# Patient Record
Sex: Female | Born: 1995 | Race: Black or African American | Hispanic: No | Marital: Single | Smoking: Current some day smoker
Health system: Southern US, Community
[De-identification: ages and names within clinical notes are randomized; demographics above are authoritative.]

## PROBLEM LIST (undated history)

## (undated) HISTORY — PX: TONSILLECTOMY: SUR1361

---

## 2014-11-17 ENCOUNTER — Emergency Department (HOSPITAL_COMMUNITY)
Admission: EM | Admit: 2014-11-17 | Discharge: 2014-11-17 | Disposition: A | Discharge status: Home / Self Care | Payer: Self-pay | Attending: Emergency Medicine | Admitting: Emergency Medicine

## 2014-11-17 ENCOUNTER — Encounter (HOSPITAL_COMMUNITY): Payer: Self-pay | Admitting: *Deleted

## 2014-11-17 DIAGNOSIS — M546 Pain in thoracic spine: Secondary | ICD-10-CM

## 2014-11-17 DIAGNOSIS — Y92410 Unspecified street and highway as the place of occurrence of the external cause: Secondary | ICD-10-CM | POA: Insufficient documentation

## 2014-11-17 DIAGNOSIS — Y998 Other external cause status: Secondary | ICD-10-CM | POA: Insufficient documentation

## 2014-11-17 DIAGNOSIS — M542 Cervicalgia: Secondary | ICD-10-CM

## 2014-11-17 DIAGNOSIS — S299XXA Unspecified injury of thorax, initial encounter: Secondary | ICD-10-CM | POA: Insufficient documentation

## 2014-11-17 DIAGNOSIS — S199XXA Unspecified injury of neck, initial encounter: Secondary | ICD-10-CM | POA: Insufficient documentation

## 2014-11-17 DIAGNOSIS — Y9389 Activity, other specified: Secondary | ICD-10-CM | POA: Insufficient documentation

## 2014-11-17 MED ORDER — IBUPROFEN 200 MG PO TABS
600.0000 mg | ORAL_TABLET | Freq: Once | ORAL | Status: AC
  Administered 2014-11-17: 600 mg via ORAL
  Filled 2014-11-17: qty 3
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
## 2014-11-17 NOTE — Discharge Instructions (Signed)
Return to the emergency room with worsening of symptoms, new symptoms or with symptoms that are concerning, especially fevers, loss of control of bladder or bowels, numbness or tingling around genital region or anus, weakness. °RICE: Rest, Ice (three cycles of 20 mins on, 20mins off at least twice a day), compression/brace, elevation. Heating pad works well for back pain. °Ibuprofen 400mg (2 tablets 200mg) every 5-6 hours for 3-5 days and then as needed for pain. °Follow up with PCP/urgent care if symptoms worsen or are persistent. ° ° °Back Pain, Adult °Low back pain is very common. About 1 in 5 people have back pain. The cause of low back pain is rarely dangerous. The pain often gets better over time. About half of people with a sudden onset of back pain feel better in just 2 weeks. About 8 in 10 people feel better by 6 weeks.  °CAUSES °Some common causes of back pain include: °· Strain of the muscles or ligaments supporting the spine. °· Wear and tear (degeneration) of the spinal discs. °· Arthritis. °· Direct injury to the back. °DIAGNOSIS °Most of the time, the direct cause of low back pain is not known. However, back pain can be treated effectively even when the exact cause of the pain is unknown. Answering your caregiver's questions about your overall health and symptoms is one of the most accurate ways to make sure the cause of your pain is not dangerous. If your caregiver needs more information, he or she may order lab work or imaging tests (X-rays or MRIs). However, even if imaging tests show changes in your back, this usually does not require surgery. °HOME CARE INSTRUCTIONS °For many people, back pain returns. Since low back pain is rarely dangerous, it is often a condition that people can learn to manage on their own.  °· Remain active. It is stressful on the back to sit or stand in one place. Do not sit, drive, or stand in one place for more than 30 minutes at a time. Take short walks on level surfaces  as soon as pain allows. Try to increase the length of time you walk each day. °· Do not stay in bed. Resting more than 1 or 2 days can delay your recovery. °· Do not avoid exercise or work. Your body is made to move. It is not dangerous to be active, even though your back may hurt. Your back will likely heal faster if you return to being active before your pain is gone. °· Pay attention to your body when you  bend and lift. Many people have less discomfort when lifting if they bend their knees, keep the load close to their bodies, and avoid twisting. Often, the most comfortable positions are those that put less stress on your recovering back. °· Find a comfortable position to sleep. Use a firm mattress and lie on your side with your knees slightly bent. If you lie on your back, put a pillow under your knees. °· Only take over-the-counter or prescription medicines as directed by your caregiver. Over-the-counter medicines to reduce pain and inflammation are often the most helpful. Your caregiver may prescribe muscle relaxant drugs. These medicines help dull your pain so you can more quickly return to your normal activities and healthy exercise. °· Put ice on the injured area. °· Put ice in a plastic bag. °· Place a towel between your skin and the bag. °· Leave the ice on for 15-20 minutes, 03-04 times a day for the first 2 to 3 days. After that, ice and heat may be alternated to reduce pain and spasms. °·   Ask your caregiver about trying back exercises and gentle massage. This may be of some benefit. °· Avoid feeling anxious or stressed. Stress increases muscle tension and can worsen back pain. It is important to recognize when you are anxious or stressed and learn ways to manage it. Exercise is a great option. °SEEK MEDICAL CARE IF: °· You have pain that is not relieved with rest or medicine. °· You have pain that does not improve in 1 week. °· You have new symptoms. °· You are generally not feeling well. °SEEK  IMMEDIATE MEDICAL CARE IF:  °· You have pain that radiates from your back into your legs. °· You develop new bowel or bladder control problems. °· You have unusual weakness or numbness in your arms or legs. °· You develop nausea or vomiting. °· You develop abdominal pain. °· You feel faint. °Document Released: 10/27/2005 Document Revised: 04/27/2012 Document Reviewed: 02/28/2014 °[employer]® Patient Information ©2015 [employer]. This information is not intended to replace advice given to you by your health care provider. Make sure you discuss any questions you have with your health care provider. ° °Cervical Sprain °A cervical sprain is an injury in the neck in which the strong, fibrous tissues (ligaments) that connect your neck bones stretch or tear. Cervical sprains can range from mild to severe. Severe cervical sprains can cause the neck vertebrae to be unstable. This can lead to damage of the spinal cord and can result in serious nervous system problems. The amount of time it takes for a cervical sprain to get better depends on the cause and extent of the injury. Most cervical sprains heal in 1 to 3 weeks. °CAUSES  °Severe cervical sprains may be caused by:  °· Contact sport injuries (such as from football, rugby, wrestling, hockey, auto racing, gymnastics, diving, martial arts, or boxing).   °· Motor vehicle collisions.   °· Whiplash injuries. This is an injury from a sudden forward and backward whipping movement of the head and neck.  °· Falls.   °Mild cervical sprains may be caused by:  °· Being in an awkward position, such as while cradling a telephone between your ear and shoulder.   °· Sitting in a chair that does not offer proper support.   °· Working at a poorly designed computer station.   °· Looking up or down for long periods of time.   °SYMPTOMS  °· Pain, soreness, stiffness, or a burning sensation in the front, back, or sides of the neck. This discomfort may develop immediately after the injury or  slowly, 24 hours or more after the injury.   °· Pain or tenderness directly in the middle of the back of the neck.   °· Shoulder or upper back pain.   °· Limited ability to move the neck.   °· Headache.   °· Dizziness.   °· Weakness, numbness, or tingling in the hands or arms.   °· Muscle spasms.   °· Difficulty swallowing or chewing.   °· Tenderness and swelling of the neck.   °DIAGNOSIS  °Most of the time your health care provider can diagnose a cervical sprain by taking your history and doing a physical exam. Your health care provider will ask about previous neck injuries and any known neck problems, such as arthritis in the neck. X-rays may be taken to find out if there are any other problems, such as with the bones of the neck. Other tests, such as a CT scan or MRI, may also be needed.  °TREATMENT  °Treatment depends on the severity of the cervical sprain. Mild sprains can be treated with rest, keeping   the neck in place (immobilization), and pain medicines. Severe cervical sprains are immediately immobilized. Further treatment is done to help with pain, muscle spasms, and other symptoms and may include: °· Medicines, such as pain relievers, numbing medicines, or muscle relaxants.   °· Physical therapy. This may involve stretching exercises, strengthening exercises, and posture training. Exercises and improved posture can help stabilize the neck, strengthen muscles, and help stop symptoms from returning.   °HOME CARE INSTRUCTIONS  °· Put ice on the injured area.   °¨ Put ice in a plastic bag.   °¨ Place a towel between your skin and the bag.   °¨ Leave the ice on for 15-20 minutes, 3-4 times a day.   °· If your injury was severe, you may have been given a cervical collar to wear. A cervical collar is a two-piece collar designed to keep your neck from moving while it heals. °¨ Do not remove the collar unless instructed by your health care provider. °¨ If you have long hair, keep it outside of the collar. °¨ Ask  your health care provider before making any adjustments to your collar. Minor adjustments may be required over time to improve comfort and reduce pressure on your chin or on the back of your head. °¨ If you are allowed to remove the collar for cleaning or bathing, follow your health care provider's instructions on how to do so safely. °¨ Keep your collar clean by wiping it with mild soap and water and drying it completely. If the collar you have been given includes removable pads, remove them every 1-2 days and hand wash them with soap and water. Allow them to air dry. They should be completely dry before you wear them in the collar. °¨ If you are allowed to remove the collar for cleaning and bathing, wash and dry the skin of your neck. Check your skin for irritation or sores. If you see any, tell your health care provider. °¨ Do not drive while wearing the collar.   °· Only take over-the-counter or prescription medicines for pain, discomfort, or fever as directed by your health care provider.   °· Keep all follow-up appointments as directed by your health care provider.   °· Keep all physical therapy appointments as directed by your health care provider.   °· Make any needed adjustments to your workstation to promote good posture.   °· Avoid positions and activities that make your symptoms worse.   °· Warm up and stretch before being active to help prevent problems.   °SEEK MEDICAL CARE IF:  °· Your pain is not controlled with medicine.   °· You are unable to decrease your pain medicine over time as planned.   °· Your activity level is not improving as expected.   °SEEK IMMEDIATE MEDICAL CARE IF:  °· You develop any bleeding. °· You develop stomach upset. °· You have signs of an allergic reaction to your medicine.   °· Your symptoms get worse.   °· You develop new, unexplained symptoms.   °· You have numbness, tingling, weakness, or paralysis in any part of your body.   °MAKE SURE YOU:  °· Understand these  instructions. °· Will watch your condition. °· Will get help right away if you are not doing well or get worse. °Document Released: 08/24/2007 Document Revised: 11/01/2013 Document Reviewed: 05/04/2013 °[employer]® Patient Information ©2015 [employer]. This information is not intended to replace advice given to you by your health care provider. Make sure you discuss any questions you have with your health care provider. ° ° °

## 2014-11-25 ENCOUNTER — Emergency Department (HOSPITAL_COMMUNITY): Payer: No Typology Code available for payment source

## 2014-11-25 ENCOUNTER — Encounter (HOSPITAL_COMMUNITY): Payer: Self-pay | Admitting: Emergency Medicine

## 2014-11-25 ENCOUNTER — Emergency Department (HOSPITAL_COMMUNITY)
Admission: EM | Admit: 2014-11-25 | Discharge: 2014-11-25 | Disposition: A | Discharge status: Home / Self Care | Payer: No Typology Code available for payment source | Attending: Emergency Medicine | Admitting: Emergency Medicine

## 2014-11-25 DIAGNOSIS — Y998 Other external cause status: Secondary | ICD-10-CM | POA: Insufficient documentation

## 2014-11-25 DIAGNOSIS — Y9389 Activity, other specified: Secondary | ICD-10-CM | POA: Insufficient documentation

## 2014-11-25 DIAGNOSIS — Z720 Tobacco use: Secondary | ICD-10-CM | POA: Insufficient documentation

## 2014-11-25 DIAGNOSIS — Z3202 Encounter for pregnancy test, result negative: Secondary | ICD-10-CM | POA: Insufficient documentation

## 2014-11-25 DIAGNOSIS — S161XXA Strain of muscle, fascia and tendon at neck level, initial encounter: Secondary | ICD-10-CM | POA: Insufficient documentation

## 2014-11-25 DIAGNOSIS — S39012A Strain of muscle, fascia and tendon of lower back, initial encounter: Secondary | ICD-10-CM | POA: Insufficient documentation

## 2014-11-25 DIAGNOSIS — Y92410 Unspecified street and highway as the place of occurrence of the external cause: Secondary | ICD-10-CM | POA: Insufficient documentation

## 2014-11-25 LAB — POC URINE PREG, ED: Preg Test, Ur: NEGATIVE

## 2014-11-25 MED ORDER — METHOCARBAMOL 500 MG PO TABS: 250.0000 mg | ORAL_TABLET | Freq: Two times a day (BID) | ORAL | Status: DC

## 2014-11-25 MED ORDER — NAPROXEN 375 MG PO TABS: 375.0000 mg | ORAL_TABLET | Freq: Two times a day (BID) | ORAL | Status: DC

## 2014-11-25 NOTE — Discharge Instructions (Signed)
Cervical Sprain °A cervical sprain is an injury in the neck in which the strong, fibrous tissues (ligaments) that connect your neck bones stretch or tear. Cervical sprains can range from mild to severe. Severe cervical sprains can cause the neck vertebrae to be unstable. This can lead to damage of the spinal cord and can result in serious nervous system problems. The amount of time it takes for a cervical sprain to get better depends on the cause and extent of the injury. Most cervical sprains heal in 1 to 3 weeks. °CAUSES  °Severe cervical sprains may be caused by:  °· Contact sport injuries (such as from football, rugby, wrestling, hockey, auto racing, gymnastics, diving, martial arts, or boxing).   °· Motor vehicle collisions.   °· Whiplash injuries. This is an injury from a sudden forward and backward whipping movement of the head and neck.  °· Falls.   °Mild cervical sprains may be caused by:  °· Being in an awkward position, such as while cradling a telephone between your ear and shoulder.   °· Sitting in a chair that does not offer proper support.   °· Working at a poorly designed computer station.   °· Looking up or down for long periods of time.   °SYMPTOMS  °· Pain, soreness, stiffness, or a burning sensation in the front, back, or sides of the neck. This discomfort may develop immediately after the injury or slowly, 24 hours or more after the injury.   °· Pain or tenderness directly in the middle of the back of the neck.   °· Shoulder or upper back pain.   °· Limited ability to move the neck.   °· Headache.   °· Dizziness.   °· Weakness, numbness, or tingling in the hands or arms.   °· Muscle spasms.   °· Difficulty swallowing or chewing.   °· Tenderness and swelling of the neck.   °DIAGNOSIS  °Most of the time your health care provider can diagnose a cervical sprain by taking your history and doing a physical exam. Your health care provider will ask about previous neck injuries and any known neck  problems, such as arthritis in the neck. X-rays may be taken to find out if there are any other problems, such as with the bones of the neck. Other tests, such as a CT scan or MRI, may also be needed.  °TREATMENT  °Treatment depends on the severity of the cervical sprain. Mild sprains can be treated with rest, keeping the neck in place (immobilization), and pain medicines. Severe cervical sprains are immediately immobilized. Further treatment is done to help with pain, muscle spasms, and other symptoms and may include: °· Medicines, such as pain relievers, numbing medicines, or muscle relaxants.   °· Physical therapy. This may involve stretching exercises, strengthening exercises, and posture training. Exercises and improved posture can help stabilize the neck, strengthen muscles, and help stop symptoms from returning.   °HOME CARE INSTRUCTIONS  °· Put ice on the injured area.   °· Put ice in a plastic bag.   °· Place a towel between your skin and the bag.   °· Leave the ice on for 15-20 minutes, 3-4 times a day.   °· If your injury was severe, you may have been given a cervical collar to wear. A cervical collar is a two-piece collar designed to keep your neck from moving while it heals. °· Do not remove the collar unless instructed by your health care provider. °· If you have long hair, keep it outside of the collar. °· Ask your health care provider before making any adjustments to your collar. Minor   adjustments may be required over time to improve comfort and reduce pressure on your chin or on the back of your head. °· If you are allowed to remove the collar for cleaning or bathing, follow your health care provider's instructions on how to do so safely. °· Keep your collar clean by wiping it with mild soap and water and drying it completely. If the collar you have been given includes removable pads, remove them every 1-2 days and hand wash them with soap and water. Allow them to air dry. They should be completely  dry before you wear them in the collar. °· If you are allowed to remove the collar for cleaning and bathing, wash and dry the skin of your neck. Check your skin for irritation or sores. If you see any, tell your health care provider. °· Do not drive while wearing the collar.   °· Only take over-the-counter or prescription medicines for pain, discomfort, or fever as directed by your health care provider.   °· Keep all follow-up appointments as directed by your health care provider.   °· Keep all physical therapy appointments as directed by your health care provider.   °· Make any needed adjustments to your workstation to promote good posture.   °· Avoid positions and activities that make your symptoms worse.   °· Warm up and stretch before being active to help prevent problems.   °SEEK MEDICAL CARE IF:  °· Your pain is not controlled with medicine.   °· You are unable to decrease your pain medicine over time as planned.   °· Your activity level is not improving as expected.   °SEEK IMMEDIATE MEDICAL CARE IF:  °· You develop any bleeding. °· You develop stomach upset. °· You have signs of an allergic reaction to your medicine.   °· Your symptoms get worse.   °· You develop new, unexplained symptoms.   °· You have numbness, tingling, weakness, or paralysis in any part of your body.   °MAKE SURE YOU:  °· Understand these instructions. °· Will watch your condition. °· Will get help right away if you are not doing well or get worse. °Document Released: 08/24/2007 Document Revised: 11/01/2013 Document Reviewed: 05/04/2013 °[employer]® Patient Information ©2015 [employer]. This information is not intended to replace advice given to you by your health care provider. Make sure you discuss any questions you have with your health care provider. ° °Lumbosacral Strain °Lumbosacral strain is a strain of any of the parts that make up your lumbosacral vertebrae. Your lumbosacral vertebrae are the bones that make up the lower third  of your backbone. Your lumbosacral vertebrae are held together by muscles and tough, fibrous tissue (ligaments).  °CAUSES  °A sudden blow to your back can cause lumbosacral strain. Also, anything that causes an excessive stretch of the muscles in the low back can cause this strain. This is typically seen when people exert themselves strenuously, fall, lift heavy objects, bend, or crouch repeatedly. °RISK FACTORS °· Physically demanding work. °· Participation in pushing or pulling sports or sports that require a sudden twist of the back (tennis, golf, baseball). °· Weight lifting. °· Excessive lower back curvature. °· Forward-tilted pelvis. °· Weak back or abdominal muscles or both. °· Tight hamstrings. °SIGNS AND SYMPTOMS  °Lumbosacral strain may cause pain in the area of your injury or pain that moves (radiates) down your leg.  °DIAGNOSIS °Your health care provider can often diagnose lumbosacral strain through a physical exam. In some cases, you may need tests such as X-ray exams.  °TREATMENT  °Treatment for your lower   back injury depends on many factors that your clinician will have to evaluate. However, most treatment will include the use of anti-inflammatory medicines. °HOME CARE INSTRUCTIONS  °· Avoid hard physical activities (tennis, racquetball, waterskiing) if you are not in proper physical condition for it. This may aggravate or create problems. °· If you have a back problem, avoid sports requiring sudden body movements. Swimming and walking are generally safer activities. °· Maintain good posture. °· Maintain a healthy weight. °· For acute conditions, you may put ice on the injured area. °¨ Put ice in a plastic bag. °¨ Place a towel between your skin and the bag. °¨ Leave the ice on for 20 minutes, 2-3 times a day. °· When the low back starts healing, stretching and strengthening exercises may be recommended. °SEEK MEDICAL CARE IF: °· Your back pain is getting worse. °· You experience severe back pain not  relieved with medicines. °SEEK IMMEDIATE MEDICAL CARE IF:  °· You have numbness, tingling, weakness, or problems with the use of your arms or legs. °· There is a change in bowel or bladder control. °· You have increasing pain in any area of the body, including your belly (abdomen). °· You notice shortness of breath, dizziness, or feel faint. °· You feel sick to your stomach (nauseous), are throwing up (vomiting), or become sweaty. °· You notice discoloration of your toes or legs, or your feet get very cold. °MAKE SURE YOU:  °· Understand these instructions. °· Will watch your condition. °· Will get help right away if you are not doing well or get worse. °Document Released: 08/06/2005 Document Revised: 11/01/2013 Document Reviewed: 06/15/2013 °[employer]® Patient Information ©2015 [employer]. This information is not intended to replace advice given to you by your health care provider. Make sure you discuss any questions you have with your health care provider. ° °
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
## 2015-07-03 ENCOUNTER — Emergency Department (HOSPITAL_COMMUNITY)
Admission: EM | Admit: 2015-07-03 | Discharge: 2015-07-04 | Disposition: A | Discharge status: Home / Self Care | Payer: Self-pay | Attending: Emergency Medicine | Admitting: Emergency Medicine

## 2015-07-03 ENCOUNTER — Encounter (HOSPITAL_COMMUNITY): Payer: Self-pay | Admitting: Emergency Medicine

## 2015-07-03 DIAGNOSIS — N390 Urinary tract infection, site not specified: Secondary | ICD-10-CM

## 2015-07-03 DIAGNOSIS — R110 Nausea: Secondary | ICD-10-CM

## 2015-07-03 DIAGNOSIS — Z3202 Encounter for pregnancy test, result negative: Secondary | ICD-10-CM | POA: Insufficient documentation

## 2015-07-03 DIAGNOSIS — Z720 Tobacco use: Secondary | ICD-10-CM | POA: Insufficient documentation

## 2015-07-03 NOTE — ED Notes (Signed)
Pt states she is nauseated and has been vomiting  Pt states she had vomiting last week then was ok for a few days and vomiting on Tuesday  Pt states she has constant nausea and for the past few days has had a constant bad taste in her mouth

## 2015-07-04 LAB — URINE MICROSCOPIC-ADD ON

## 2015-07-04 LAB — PREGNANCY, URINE: PREG TEST UR: NEGATIVE

## 2015-07-04 LAB — URINALYSIS, ROUTINE W REFLEX MICROSCOPIC
Bilirubin Urine: NEGATIVE
Glucose, UA: NEGATIVE mg/dL
Ketones, ur: NEGATIVE mg/dL
LEUKOCYTES UA: NEGATIVE
Nitrite: POSITIVE — AB
Protein, ur: NEGATIVE mg/dL
Specific Gravity, Urine: 1.012 (ref 1.005–1.030)
UROBILINOGEN UA: 0.2 mg/dL (ref 0.0–1.0)
pH: 6 (ref 5.0–8.0)

## 2015-07-04 MED ORDER — PROMETHAZINE HCL 25 MG PO TABS: 25.0000 mg | ORAL_TABLET | Freq: Four times a day (QID) | ORAL | Status: AC | PRN

## 2015-07-04 MED ORDER — CEPHALEXIN 500 MG PO CAPS
500.0000 mg | ORAL_CAPSULE | Freq: Once | ORAL | Status: AC
  Administered 2015-07-04: 500 mg via ORAL
  Filled 2015-07-04: qty 1

## 2015-07-04 MED ORDER — CEPHALEXIN 500 MG PO CAPS: 500.0000 mg | ORAL_CAPSULE | Freq: Four times a day (QID) | ORAL | Status: AC

## 2015-07-04 MED ORDER — ONDANSETRON 8 MG PO TBDP
8.0000 mg | ORAL_TABLET | Freq: Once | ORAL | Status: AC
Start: 2015-07-04 — End: 2015-07-04
  Administered 2015-07-04: 8 mg via ORAL
  Filled 2015-07-04: qty 1

## 2015-07-04 NOTE — Discharge Instructions (Signed)

## 2015-07-04 NOTE — ED Notes (Signed)
Pt given sprite for PO challenge

## 2015-07-04 NOTE — ED Provider Notes (Signed)
CSN: [id number]     Arrival date & time 07/03/15  2348 History   First MD Initiated Contact with Patient 07/03/15 2358     Chief Complaint  Patient presents with  . Emesis     (Consider location/radiation/quality/duration/timing/severity/associated sxs/prior Treatment) Patient is a 19 y.o. female presenting with vomiting. The history is provided by the patient. No language interpreter was used.  Emesis Severity:  Mild Duration:  3 days Able to tolerate:  Liquids and solids Progression:  Unchanged Chronicity:  New Associated symptoms: no abdominal pain, no chills and no diarrhea   Associated symptoms comment:  She reports nausea with infrequent vomiting for the past 3 days. No diarrhea, abdominal pain, chest pain or SOB. She denies dysuria, vaginal discharge or abnormal bleeding. She Is unsure if pregnant or not but reports it is a possibility.    History reviewed. No pertinent past medical history. Past Surgical History  Procedure Laterality Date  . Tonsillectomy     Family History  Problem Relation Age of Onset  . Diabetes Mother   . Hypertension Mother   . Diabetes Father   . Hypertension Father    Social History  Substance Use Topics  . Smoking status: Current Some Day Smoker  . Smokeless tobacco: None  . Alcohol Use: No   OB History    No data available     Review of Systems  Constitutional: Negative for fever and chills.  HENT: Negative.   Respiratory: Negative.   Cardiovascular: Negative.   Gastrointestinal: Positive for nausea and vomiting. Negative for abdominal pain and diarrhea.  Genitourinary: Negative.   Musculoskeletal: Negative.   Skin: Negative.   Neurological: Negative.  Negative for light-headedness.      Allergies  Review of patient's allergies indicates no known allergies.  Home Medications   Prior to Admission medications   Not on File   BP 134/74 mmHg  Pulse 84  Temp(Src) 98.4 F (36.9 C) (Oral)  Resp 18  Ht 5\' 7"  (1.702 m)   Wt 220 lb (99.791 kg)  BMI 34.45 kg/m2  SpO2 100%  LMP 06/27/2015 (Approximate) Physical Exam  Constitutional: She is oriented to person, place, and time. She appears well-developed and well-nourished.  HENT:  Head: Normocephalic.  Neck: Normal range of motion. Neck supple.  Cardiovascular: Normal rate and regular rhythm.   Pulmonary/Chest: Effort normal and breath sounds normal.  Abdominal: Soft. Bowel sounds are normal. There is no tenderness. There is no rebound and no guarding.  Musculoskeletal: Normal range of motion.  Neurological: She is alert and oriented to person, place, and time.  Skin: Skin is warm and dry. No rash noted.  Psychiatric: She has a normal mood and affect.    ED Course  Procedures (including critical care time) Labs Review Labs Reviewed  URINALYSIS, ROUTINE W REFLEX MICROSCOPIC (NOT AT [facility])  PREGNANCY, URINE   Results for orders placed or performed during the hospital encounter of 07/03/15  Urinalysis, Routine w reflex microscopic (not at [facility])  Result Value Ref Range   Color, Urine YELLOW YELLOW   APPearance CLOUDY (A) CLEAR   Specific Gravity, Urine 1.012 1.005 - 1.030   pH 6.0 5.0 - 8.0   Glucose, UA NEGATIVE NEGATIVE mg/dL   Hgb urine dipstick TRACE (A) NEGATIVE   Bilirubin Urine NEGATIVE NEGATIVE   Ketones, ur NEGATIVE NEGATIVE mg/dL   Protein, ur NEGATIVE NEGATIVE mg/dL   Urobilinogen, UA 0.2 0.0 - 1.0 mg/dL   Nitrite POSITIVE (A) NEGATIVE   Leukocytes, UA NEGATIVE  NEGATIVE  Pregnancy, urine  Result Value Ref Range   Preg Test, Ur NEGATIVE NEGATIVE  Urine microscopic-add on  Result Value Ref Range   Squamous Epithelial / LPF RARE RARE   WBC, UA 3-6 <3 WBC/hpf   Bacteria, UA MANY (A) RARE     Imaging Review No results found. I have personally reviewed and evaluated these images and lab results as part of my medical decision-making.   EKG Interpretation None      MDM   Final diagnoses:  None    1. UTI 2. Nausea  Nausea  resolved with medications. She has evidence of UTI treatable with abx. Stable otherwise and ready for discharge home.   Elpidio Anis, PA-C 07/04/15 4098  Loren Racer, MD 07/04/15 [phone]
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
## 2017-01-23 ENCOUNTER — Inpatient Hospital Stay (HOSPITAL_COMMUNITY)
Admission: AD | Admit: 2017-01-23 | Discharge: 2017-01-23 | Disposition: A | Discharge status: Home / Self Care | Payer: Self-pay | Source: Ambulatory Visit | Attending: Obstetrics and Gynecology | Admitting: Obstetrics and Gynecology

## 2017-01-23 DIAGNOSIS — F17200 Nicotine dependence, unspecified, uncomplicated: Secondary | ICD-10-CM | POA: Insufficient documentation

## 2017-01-23 DIAGNOSIS — Z79899 Other long term (current) drug therapy: Secondary | ICD-10-CM | POA: Insufficient documentation

## 2017-01-23 DIAGNOSIS — N898 Other specified noninflammatory disorders of vagina: Secondary | ICD-10-CM | POA: Insufficient documentation

## 2017-01-23 DIAGNOSIS — N3000 Acute cystitis without hematuria: Secondary | ICD-10-CM | POA: Insufficient documentation

## 2017-01-23 DIAGNOSIS — R102 Pelvic and perineal pain: Secondary | ICD-10-CM | POA: Insufficient documentation

## 2017-01-23 DIAGNOSIS — Z3169 Encounter for other general counseling and advice on procreation: Secondary | ICD-10-CM | POA: Insufficient documentation

## 2017-01-23 DIAGNOSIS — Z8249 Family history of ischemic heart disease and other diseases of the circulatory system: Secondary | ICD-10-CM | POA: Insufficient documentation

## 2017-01-23 DIAGNOSIS — Z3009 Encounter for other general counseling and advice on contraception: Secondary | ICD-10-CM

## 2017-01-23 DIAGNOSIS — Z9889 Other specified postprocedural states: Secondary | ICD-10-CM | POA: Insufficient documentation

## 2017-01-23 DIAGNOSIS — Z833 Family history of diabetes mellitus: Secondary | ICD-10-CM | POA: Insufficient documentation

## 2017-01-23 LAB — WET PREP, GENITAL
Sperm: NONE SEEN
Trich, Wet Prep: NONE SEEN
YEAST WET PREP: NONE SEEN

## 2017-01-23 LAB — URINALYSIS, ROUTINE W REFLEX MICROSCOPIC
Bilirubin Urine: NEGATIVE
Glucose, UA: NEGATIVE mg/dL
Ketones, ur: NEGATIVE mg/dL
Leukocytes, UA: NEGATIVE
NITRITE: POSITIVE — AB
Protein, ur: NEGATIVE mg/dL
SPECIFIC GRAVITY, URINE: 1.026 (ref 1.005–1.030)
SQUAMOUS EPITHELIAL / LPF: NONE SEEN
pH: 5 (ref 5.0–8.0)

## 2017-01-23 LAB — POCT PREGNANCY, URINE: PREG TEST UR: NEGATIVE

## 2017-01-23 MED ORDER — NORGESTIMATE-ETH ESTRADIOL 0.25-35 MG-MCG PO TABS: 1.0000 | ORAL_TABLET | Freq: Every day | ORAL | 11 refills | Status: AC

## 2017-01-23 MED ORDER — NITROFURANTOIN MONOHYD MACRO 100 MG PO CAPS
100.0000 mg | ORAL_CAPSULE | Freq: Two times a day (BID) | ORAL | 0 refills | Status: AC
Start: 2017-01-23 — End: ?

## 2017-01-23 NOTE — MAU Provider Note (Signed)
History     CSN: [id number]  Arrival date and time: 01/23/17 0455   First Provider Initiated Contact with Patient 01/23/17 0531      Chief Complaint  Patient presents with  . Pelvic Pain   Pelvic Pain  The patient's primary symptoms include pelvic pain. The patient's pertinent negatives include no vaginal discharge. This is a new problem. Episode onset: 2 days ago.  The problem occurs constantly. The problem has been gradually worsening. Pain severity now: 8/10  The problem affects both sides. She is not pregnant. Associated symptoms include nausea. Pertinent negatives include no chills, dysuria, fever, frequency or urgency. The vaginal discharge was white. There has been no bleeding. The symptoms are aggravated by activity. She has tried nothing for the symptoms. She is sexually active. It is possible that her partner has an STD. She uses nothing (patient is interested OCPs. She has been on them in the past, and did not have any problems. ) for contraception. Her menstrual history has been irregular ("Had one about 4 weeks ago").    No past medical history on file.  Past Surgical History:  Procedure Laterality Date  . TONSILLECTOMY      Family History  Problem Relation Age of Onset  . Diabetes Mother   . Hypertension Mother   . Diabetes Father   . Hypertension Father     Social History  Substance Use Topics  . Smoking status: Current Some Day Smoker  . Smokeless tobacco: Not on file  . Alcohol use No    Allergies: No Known Allergies  Prescriptions Prior to Admission  Medication Sig Dispense Refill Last Dose  . cephALEXin (KEFLEX) 500 MG capsule Take 1 capsule (500 mg total) by mouth 4 (four) times daily. 20 capsule 0   . promethazine (PHENERGAN) 25 MG tablet Take 1 tablet (25 mg total) by mouth every 6 (six) hours as needed for nausea or vomiting. 12 tablet 0     Review of Systems  Constitutional: Negative for chills and fever.  Gastrointestinal: Positive for  nausea.  Genitourinary: Positive for pelvic pain. Negative for dysuria, frequency, urgency, vaginal bleeding and vaginal discharge.   Physical Exam   There were no vitals taken for this visit.  Physical Exam  Nursing note and vitals reviewed. Constitutional: She is oriented to person, place, and time. She appears well-developed and well-nourished. No distress.  HENT:  Head: Normocephalic.  Cardiovascular: Normal rate.   Respiratory: Effort normal.  Musculoskeletal: Normal range of motion.  Neurological: She is alert and oriented to person, place, and time.  Skin: Skin is warm and dry.  Psychiatric: She has a normal mood and affect.    Results for orders placed or performed during the hospital encounter of 01/23/17 (from the past 24 hour(s))  Urinalysis, Routine w reflex microscopic     Status: Abnormal   Collection Time: 01/23/17  5:00 AM  Result Value Ref Range   Color, Urine YELLOW YELLOW   APPearance HAZY (A) CLEAR   Specific Gravity, Urine 1.026 1.005 - 1.030   pH 5.0 5.0 - 8.0   Glucose, UA NEGATIVE NEGATIVE mg/dL   Hgb urine dipstick SMALL (A) NEGATIVE   Bilirubin Urine NEGATIVE NEGATIVE   Ketones, ur NEGATIVE NEGATIVE mg/dL   Protein, ur NEGATIVE NEGATIVE mg/dL   Nitrite POSITIVE (A) NEGATIVE   Leukocytes, UA NEGATIVE NEGATIVE   RBC / HPF 0-5 0 - 5 RBC/hpf   WBC, UA 6-30 0 - 5 WBC/hpf   Bacteria, UA MANY (A)  NONE SEEN   Squamous Epithelial / LPF NONE SEEN NONE SEEN   Mucous PRESENT   Pregnancy, urine POC     Status: None   Collection Time: 01/23/17  5:07 AM  Result Value Ref Range   Preg Test, Ur NEGATIVE NEGATIVE  Wet prep, genital     Status: Abnormal   Collection Time: 01/23/17  5:10 AM  Result Value Ref Range   Yeast Wet Prep HPF POC NONE SEEN NONE SEEN   Trich, Wet Prep NONE SEEN NONE SEEN   Clue Cells Wet Prep HPF POC PRESENT (A) NONE SEEN   WBC, Wet Prep HPF POC FEW (A) NONE SEEN   Sperm NONE SEEN     MAU Course  Procedures  MDM   Assessment  and Plan   1. Acute cystitis without hematuria   2. Encounter for other general counseling or advice on contraception    DC home Comfort measures reviewed  Contraception counseling and warning signs reviewed  RX: macrobid BID #10, Sprintec 28 with 11 RF  Return to MAU as needed FU with OB as planned  Follow-up Information    [facility] Follow up.   Contact information: [address] [place] [address] [phone]            Tawnya Crook 01/23/2017, 5:34 AM

## 2017-01-23 NOTE — Discharge Instructions (Signed)
Oral Contraception Information Oral contraceptive pills (OCPs) are medicines taken to prevent pregnancy. OCPs work by preventing the ovaries from releasing eggs. The hormones in OCPs also cause the cervical mucus to thicken, preventing the sperm from entering the uterus. The hormones also cause the uterine lining to become thin, not allowing a fertilized egg to attach to the inside of the uterus. OCPs are highly effective when taken exactly as prescribed. However, OCPs do not prevent sexually transmitted diseases (STDs). Safe sex practices, such as using condoms along with the pill, can help prevent STDs. Before taking the pill, you may have a physical exam and Pap test. Your health care provider may order blood tests. The health care provider will make sure you are a good candidate for oral contraception. Discuss with your health care provider the possible side effects of the OCP you may be prescribed. When starting an OCP, it can take 2 to 3 months for the body to adjust to the changes in hormone levels in your body. Types of oral contraception  The combination pill--This pill contains estrogen and progestin (synthetic progesterone) hormones. The combination pill comes in 21-day, 28-day, or 91-day packs. Some types of combination pills are meant to be taken continuously (365-day pills). With 21-day packs, you do not take pills for 7 days after the last pill. With 28-day packs, the pill is taken every day. The last 7 pills are without hormones. Certain types of pills have more than 21 hormone-containing pills. With 91-day packs, the first 84 pills contain both hormones, and the last 7 pills contain no hormones or contain estrogen only.  The minipill--This pill contains the progesterone hormone only. The pill is taken every day continuously. It is very important to take the pill at the same time each day. The minipill comes in packs of 28 pills. All 28 pills contain the hormone. Advantages of oral  contraceptive pills  Decreases premenstrual symptoms.  Treats menstrual period cramps.  Regulates the menstrual cycle.  Decreases a heavy menstrual flow.  May treatacne, depending on the type of pill.  Treats abnormal uterine bleeding.  Treats polycystic ovarian syndrome.  Treats endometriosis.  Can be used as emergency contraception. Things that can make oral contraceptive pills less effective OCPs can be less effective if:  You forget to take the pill at the same time every day.  You have a stomach or intestinal disease that lessens the absorption of the pill.  You take OCPs with other medicines that make OCPs less effective, such as antibiotics, certain HIV medicines, and some seizure medicines.  You take expired OCPs.  You forget to restart the pill on day 7, when using the packs of 21 pills. Risks associated with oral contraceptive pills Oral contraceptive pills can sometimes cause side effects, such as:  Headache.  Nausea.  Breast tenderness.  Irregular bleeding or spotting. Combination pills are also associated with a small increased risk of:  Blood clots.  Heart attack.  Stroke. This information is not intended to replace advice given to you by your health care provider. Make sure you discuss any questions you have with your health care provider. Document Released: 01/17/2003 Document Revised: 04/03/2016 Document Reviewed: 04/17/2013 Elsevier Interactive Patient Education  2017 [employer]  Urinary Tract Infection, Adult A urinary tract infection (UTI) is an infection of any part of the urinary tract. The urinary tract includes the:  Kidneys.  Ureters.  Bladder.  Urethra. These organs make, store, and get rid of pee (urine) in the  body. Follow these instructions at home:  Take over-the-counter and prescription medicines only as told by your doctor.  If you were prescribed an antibiotic medicine, take it as told by your doctor. Do not stop  taking the antibiotic even if you start to feel better.  Avoid the following drinks:  Alcohol.  Caffeine.  Tea.  Carbonated drinks.  Drink enough fluid to keep your pee clear or pale yellow.  Keep all follow-up visits as told by your doctor. This is important.  Make sure to:  Empty your bladder often and completely. Do not to hold pee for long periods of time.  Empty your bladder before and after sex.  Wipe from front to back after a bowel movement if you are female. Use each tissue one time when you wipe. Contact a doctor if:  You have back pain.  You have a fever.  You feel sick to your stomach (nauseous).  You throw up (vomit).  Your symptoms do not get better after 3 days.  Your symptoms go away and then come back. Get help right away if:  You have very bad back pain.  You have very bad lower belly (abdominal) pain.  You are throwing up and cannot keep down any medicines or water. This information is not intended to replace advice given to you by your health care provider. Make sure you discuss any questions you have with your health care provider. Document Released: 04/14/2008 Document Revised: 04/03/2016 Document Reviewed: 09/17/2015 Elsevier Interactive Patient Education  2017 [employer].

## 2017-01-24 LAB — GC/CHLAMYDIA PROBE AMP (~~LOC~~) NOT AT ARMC
Chlamydia: NEGATIVE
NEISSERIA GONORRHEA: NEGATIVE
